# Patient Record
Sex: Male | Born: 1985 | Race: White | Hispanic: Yes | Marital: Single | Smoking: Current every day smoker
Health system: Southern US, Community
[De-identification: ages and names within clinical notes are randomized; demographics above are authoritative.]

## PROBLEM LIST (undated history)

## (undated) DIAGNOSIS — Z789 Other specified health status: Secondary | ICD-10-CM

## (undated) HISTORY — PX: NO PAST SURGERIES: SHX2092

---

## 2021-02-07 ENCOUNTER — Emergency Department (HOSPITAL_COMMUNITY): Payer: Self-pay

## 2021-02-07 ENCOUNTER — Other Ambulatory Visit: Payer: Self-pay

## 2021-02-07 ENCOUNTER — Encounter (HOSPITAL_COMMUNITY): Payer: Self-pay

## 2021-02-07 ENCOUNTER — Emergency Department (HOSPITAL_COMMUNITY)
Admission: EM | Admit: 2021-02-07 | Discharge: 2021-02-08 | Disposition: A | Discharge status: Home / Self Care | Payer: Self-pay | Attending: Emergency Medicine | Admitting: Emergency Medicine

## 2021-02-07 DIAGNOSIS — S71102A Unspecified open wound, left thigh, initial encounter: Secondary | ICD-10-CM | POA: Insufficient documentation

## 2021-02-07 DIAGNOSIS — Z23 Encounter for immunization: Secondary | ICD-10-CM | POA: Insufficient documentation

## 2021-02-07 DIAGNOSIS — S81802A Unspecified open wound, left lower leg, initial encounter: Secondary | ICD-10-CM | POA: Insufficient documentation

## 2021-02-07 DIAGNOSIS — W3400XA Accidental discharge from unspecified firearms or gun, initial encounter: Secondary | ICD-10-CM

## 2021-02-07 DIAGNOSIS — R519 Headache, unspecified: Secondary | ICD-10-CM | POA: Insufficient documentation

## 2021-02-07 DIAGNOSIS — S0993XA Unspecified injury of face, initial encounter: Secondary | ICD-10-CM | POA: Insufficient documentation

## 2021-02-07 DIAGNOSIS — Z20822 Contact with and (suspected) exposure to covid-19: Secondary | ICD-10-CM | POA: Insufficient documentation

## 2021-02-07 HISTORY — DX: Other specified health status: Z78.9

## 2021-02-07 LAB — CBC
HCT: 45.6 % (ref 39.0–52.0)
Hemoglobin: 15.5 g/dL (ref 13.0–17.0)
MCH: 30.4 pg (ref 26.0–34.0)
MCHC: 34 g/dL (ref 30.0–36.0)
MCV: 89.4 fL (ref 80.0–100.0)
Platelets: 243 10*3/uL (ref 150–400)
RBC: 5.1 MIL/uL (ref 4.22–5.81)
RDW: 13.2 % (ref 11.5–15.5)
WBC: 8.1 10*3/uL (ref 4.0–10.5)
nRBC: 0 % (ref 0.0–0.2)

## 2021-02-07 LAB — LACTIC ACID, PLASMA: Lactic Acid, Venous: 4.1 mmol/L (ref 0.5–1.9)

## 2021-02-07 LAB — I-STAT CHEM 8, ED
BUN: 14 mg/dL (ref 6–20)
Calcium, Ion: 1.06 mmol/L — ABNORMAL LOW (ref 1.15–1.40)
Chloride: 107 mmol/L (ref 98–111)
Creatinine, Ser: 1.4 mg/dL — ABNORMAL HIGH (ref 0.61–1.24)
Glucose, Bld: 112 mg/dL — ABNORMAL HIGH (ref 70–99)
HCT: 47 % (ref 39.0–52.0)
Hemoglobin: 16 g/dL (ref 13.0–17.0)
Potassium: 3.8 mmol/L (ref 3.5–5.1)
Sodium: 141 mmol/L (ref 135–145)
TCO2: 19 mmol/L — ABNORMAL LOW (ref 22–32)

## 2021-02-07 LAB — PROTIME-INR
INR: 0.9 (ref 0.8–1.2)
Prothrombin Time: 11.3 seconds — ABNORMAL LOW (ref 11.4–15.2)

## 2021-02-07 LAB — COMPREHENSIVE METABOLIC PANEL
ALT: 35 U/L (ref 0–44)
AST: 43 U/L — ABNORMAL HIGH (ref 15–41)
Albumin: 4.1 g/dL (ref 3.5–5.0)
Alkaline Phosphatase: 92 U/L (ref 38–126)
Anion gap: 12 (ref 5–15)
BUN: 12 mg/dL (ref 6–20)
CO2: 18 mmol/L — ABNORMAL LOW (ref 22–32)
Calcium: 8.5 mg/dL — ABNORMAL LOW (ref 8.9–10.3)
Chloride: 108 mmol/L (ref 98–111)
Creatinine, Ser: 1.16 mg/dL (ref 0.61–1.24)
GFR, Estimated: >60 mL/min (ref >60)
Glucose, Bld: 118 mg/dL — ABNORMAL HIGH (ref 70–99)
Potassium: 3.7 mmol/L (ref 3.5–5.1)
Sodium: 138 mmol/L (ref 135–145)
Total Bilirubin: 0.8 mg/dL (ref 0.3–1.2)
Total Protein: 7.1 g/dL (ref 6.5–8.1)

## 2021-02-07 LAB — SAMPLE TO BLOOD BANK

## 2021-02-07 LAB — ETHANOL: Alcohol, Ethyl (B): 231 mg/dL — ABNORMAL HIGH (ref <10)

## 2021-02-07 MED ORDER — IOHEXOL 350 MG/ML SOLN
100.0000 mL | Freq: Once | INTRAVENOUS | Status: AC | PRN
  Administered 2021-02-07: 100 mL via INTRAVENOUS

## 2021-02-07 MED ORDER — FENTANYL CITRATE (PF) 100 MCG/2ML IJ SOLN
100.0000 ug | Freq: Once | INTRAMUSCULAR | Status: AC
  Administered 2021-02-07: 100 ug via INTRAVENOUS

## 2021-02-07 MED ORDER — TETANUS-DIPHTH-ACELL PERTUSSIS 5-2.5-18.5 LF-MCG/0.5 IM SUSY
0.5000 mL | PREFILLED_SYRINGE | Freq: Once | INTRAMUSCULAR | Status: AC
  Administered 2021-02-07: 0.5 mL via INTRAMUSCULAR
  Filled 2021-02-07: qty 0.5
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
## 2021-02-07 NOTE — ED Notes (Signed)
Patient transported to CT 
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
## 2021-02-08 LAB — URINALYSIS, ROUTINE W REFLEX MICROSCOPIC
Bacteria, UA: NONE SEEN
Bilirubin Urine: NEGATIVE
Glucose, UA: NEGATIVE mg/dL
Ketones, ur: NEGATIVE mg/dL
Leukocytes,Ua: NEGATIVE
Nitrite: NEGATIVE
Protein, ur: NEGATIVE mg/dL
Specific Gravity, Urine: 1.019 (ref 1.005–1.030)
pH: 6 (ref 5.0–8.0)

## 2021-02-08 LAB — RESP PANEL BY RT-PCR (FLU A&B, COVID) ARPGX2
Influenza A by PCR: NEGATIVE
Influenza B by PCR: NEGATIVE
SARS Coronavirus 2 by RT PCR: NEGATIVE

## 2021-02-08 MED ORDER — FENTANYL CITRATE (PF) 100 MCG/2ML IJ SOLN
50.0000 ug | Freq: Once | INTRAMUSCULAR | Status: AC
  Administered 2021-02-08: 50 ug via INTRAVENOUS
  Filled 2021-02-08: qty 2

## 2021-02-08 MED ORDER — SODIUM CHLORIDE 0.9 % IV SOLN
INTRAVENOUS | Status: AC | PRN
  Administered 2021-02-08: 1000 mL via INTRAVENOUS

## 2021-02-08 NOTE — ED Notes (Signed)
Patient given ginger ale to drink 
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
